# Patient Record
Sex: Male | Born: 1940 | Race: White | Hispanic: No | Marital: Married | Smoking: Never smoker
Health system: Southern US, Community
[De-identification: ages and names within clinical notes are randomized; demographics above are authoritative.]

## PROBLEM LIST (undated history)

## (undated) HISTORY — PX: EYE SURGERY: SHX253

---

## 2001-08-24 ENCOUNTER — Encounter: Payer: Self-pay | Admitting: Ophthalmology

## 2001-08-24 ENCOUNTER — Ambulatory Visit (HOSPITAL_COMMUNITY): Admission: RE | Admit: 2001-08-24 | Discharge: 2001-08-25 | Payer: Self-pay | Admitting: Ophthalmology

## 2002-04-03 ENCOUNTER — Encounter (INDEPENDENT_AMBULATORY_CARE_PROVIDER_SITE_OTHER): Payer: Self-pay

## 2002-04-03 ENCOUNTER — Ambulatory Visit (HOSPITAL_COMMUNITY): Admission: RE | Admit: 2002-04-03 | Discharge: 2002-04-03 | Payer: Self-pay | Admitting: Gastroenterology

## 2002-05-15 ENCOUNTER — Encounter: Admission: RE | Admit: 2002-05-15 | Discharge: 2002-08-13 | Payer: Self-pay | Admitting: Endocrinology

## 2003-05-14 ENCOUNTER — Ambulatory Visit (HOSPITAL_COMMUNITY): Admission: RE | Admit: 2003-05-14 | Discharge: 2003-05-14 | Payer: Self-pay | Admitting: Ophthalmology

## 2004-04-23 ENCOUNTER — Ambulatory Visit (HOSPITAL_BASED_OUTPATIENT_CLINIC_OR_DEPARTMENT_OTHER): Admission: RE | Admit: 2004-04-23 | Discharge: 2004-04-23 | Payer: Self-pay | Admitting: Plastic Surgery

## 2006-07-02 ENCOUNTER — Encounter: Admission: RE | Admit: 2006-07-02 | Discharge: 2006-07-02 | Payer: Self-pay | Admitting: Endocrinology

## 2006-11-04 ENCOUNTER — Encounter: Admission: RE | Admit: 2006-11-04 | Discharge: 2006-11-04 | Payer: Self-pay | Admitting: Endocrinology

## 2008-05-18 IMAGING — CT CT CHEST W/O CM
2 series · 11 of 33 positions shown, 12 images · non-contrast
Comparison: NONE

CLINICAL DATA: Diabetes.  Hypercholesterolemia.  Positive family 
history.  

CORONARY CT ANGIOGRAM
TECHNIQUE: Helical 64 slice gated CT angiography of the heart was 
performed following IV administration of 100 cc of Optiray 350.  
Segmentation of the coronary arteries was created in 2-D and 3-D 
volume rendered.  Ventricular wall motion was additionally 
assessed. NON-CORONARY

[Series 2: — · axial · 0.49mm/px · z∈[+997,+1055]mm · 3 of 70 slices shown, 4 images]
[im 24/70  mediastinal]
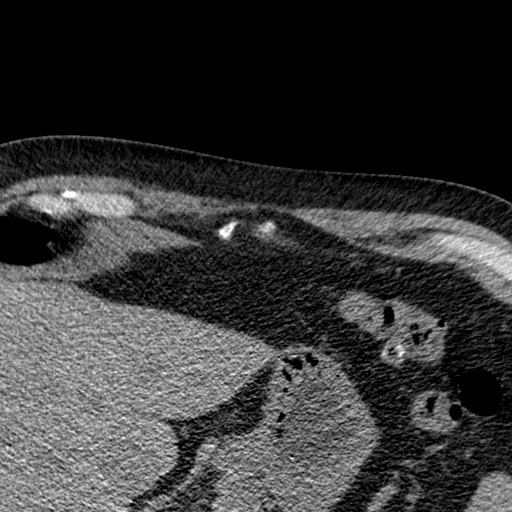
[im 24/70  lung]
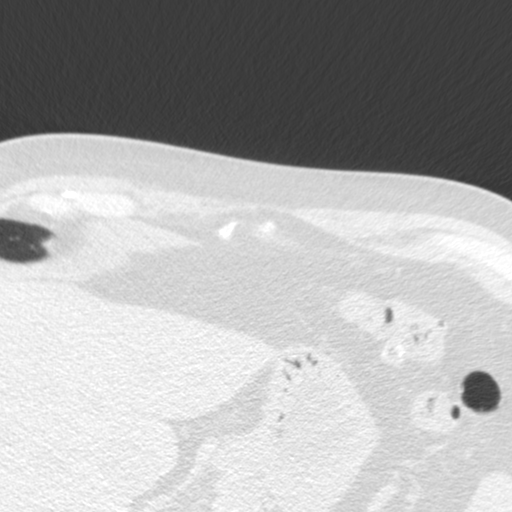
[im 31/70  lung]
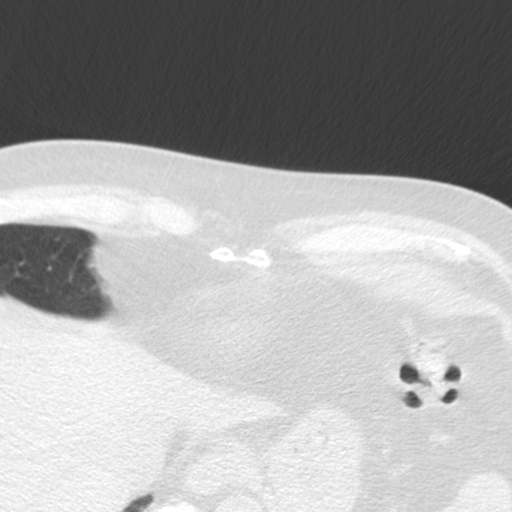
[im 47/70  lung]
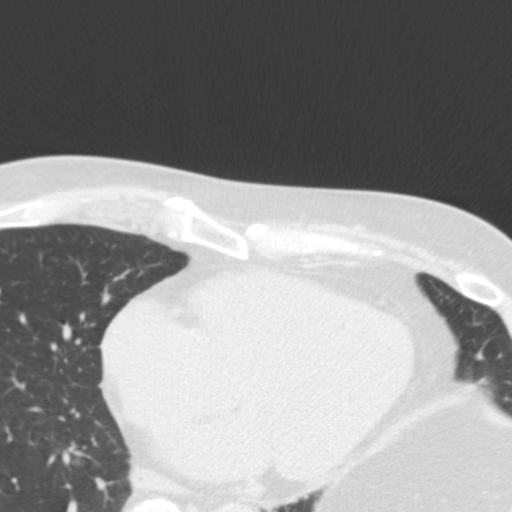

[Series 5: 75.0% · axial · 0.50mm/px · z∈[+950,+1095]mm · 8 of 393 slices shown]
[im 35/393  lung]
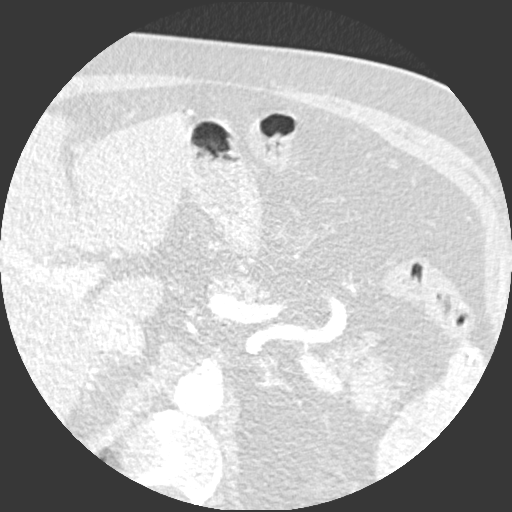
[im 86/393  lung]
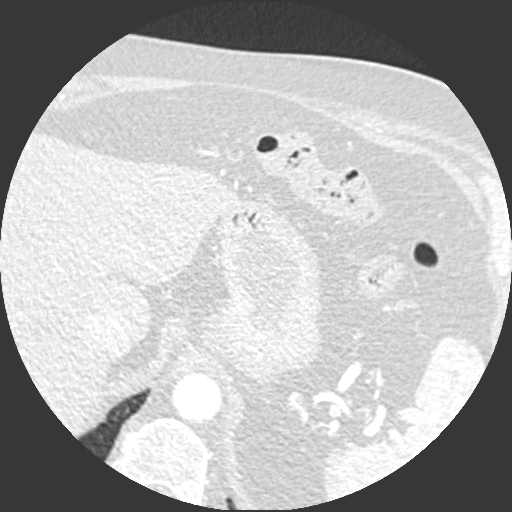
[im 137/393  lung]
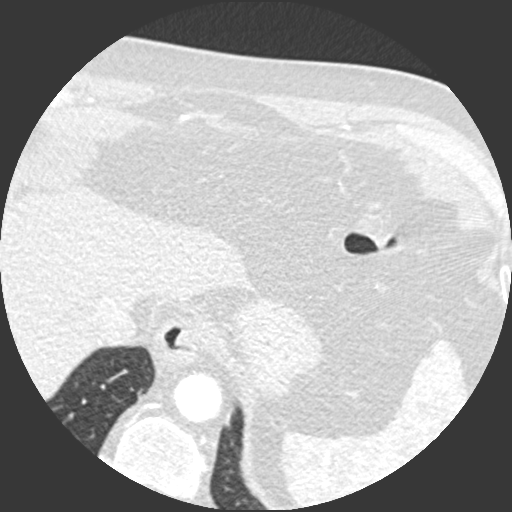
[im 177/393  lung]
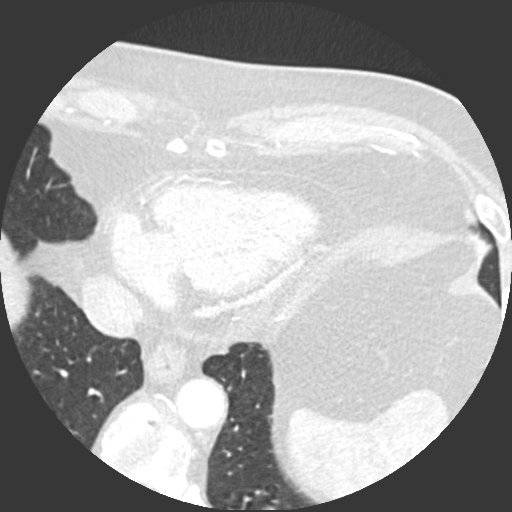
[im 205/393  lung]
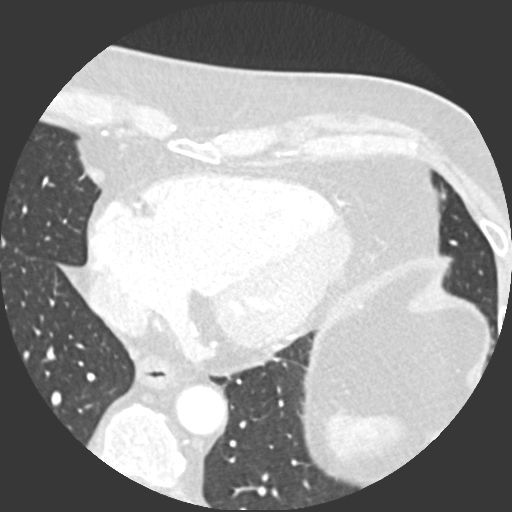
[im 256/393  lung]
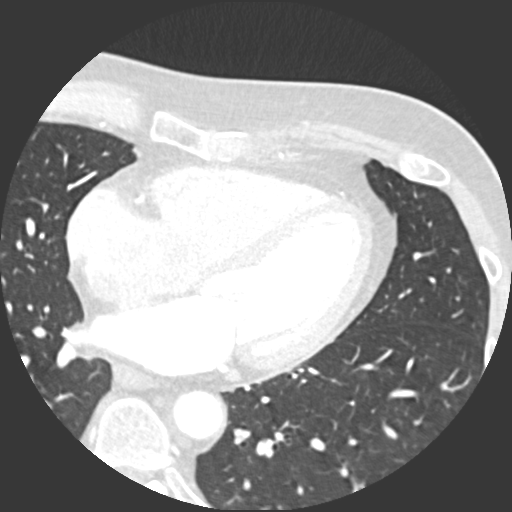
[im 307/393  lung]
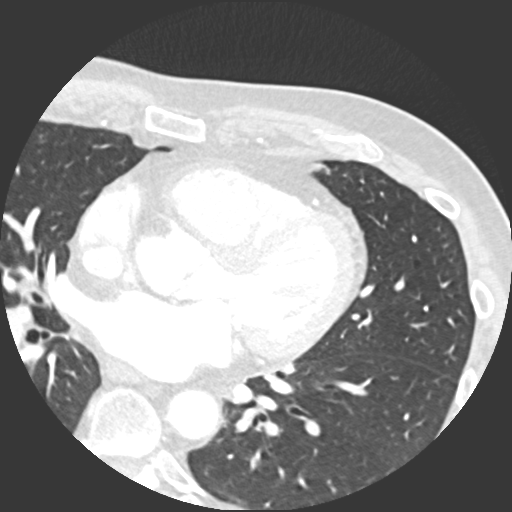
[im 358/393  lung]
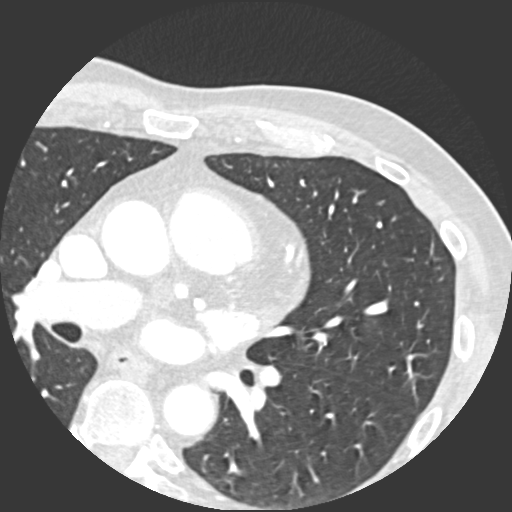

[11 of 33 positions shown; findings below may reference images not displayed]

FINDINGS: The visualized portions of the lung fields are clear.  
The aortic diameter is normal, ascending aorta 3.0 cm maximally.  
Slight calcific plaque in the aorta, no aneurysm or dissection.  
No central pulmonary emboli.  Atrium size is normal, no left 
atrial thrombus.  No septal defect.  Small calcification in the 
mitral annulus.  No significant valvular disease. Left Ventricular 
Wall Motion Normal myocardial contractility and myocardial 
thickness.  No scarring or infarct visible.  Left ventricular 
ejection fraction is 56%. CORONARY
FINDINGS: RIGHT CORONARY ARTERY The right coronary artery is a 
small artery, nondominant, terminating in acute marginal branches. 
 Mild diffuse atherosclerotic plaque, no significant occlusion. 
LEFT MAIN CORONARY ARTERY The left main coronary artery is short 
artery of large caliber with excentric calcific plaque, 
nonocclusive.  It gives rise to a left anterior descending and 
left circumflex coronary artery. LEFT ANTERIOR DESCENDING CORONARY 
ARTERY The left anterior descending coronary artery is a large 
artery containing mild nonocclusive calcific plaque proximally.  
There are also numerous small nonocclusive lipid plaques along the 
course of the artery.  The left anterior descending artery has a 
single large diagonal branch and several small perforators. LEFT 
CIRCUMFLEX CORONARY ARTERY The left circumflex coronary artery is 
a large artery supplying the posterior artery.  It has 2 large 
obtuse marginal branches.  Mild excentric nonocclusive calcific 
plaque is noted in the proximal circumflex coronary artery.  
Numerous smaller nonocclusive mixed calcific and lipid plaques are 
present along its proximal and mid course. SUMMARY: Left coronary 
artery dominance with a large circumflex coronary artery supplying 
the posterior descending artery.  Small right coronary artery with 
mild, nonocclusive, multifocal plaque; mixed lipid and calcific.  
No high-grade occlusion.  Normal left ventricular wall motion. 
Dict Date: 07/29/2006  Tran Date: 07/29/2006 NBC  JLM

## 2008-08-24 IMAGING — US US ABDOMEN COMPLETE
1 series · 14 of 25 positions shown · non-contrast
Comparison: None.

ABDOMEN ULTRASOUND:

CLINICAL DATA: Elevated bilirubin
TECHNIQUE: Complete abdominal ultrasound examination was performed including
evaluation of the liver, gallbladder, bile ducts, pancreas, kidneys, spleen,
IVC, and abdominal aorta.

[Series 1: us abdomen complete · 0.41mm/px · 14 of 99 slices shown]
[im 1/99]
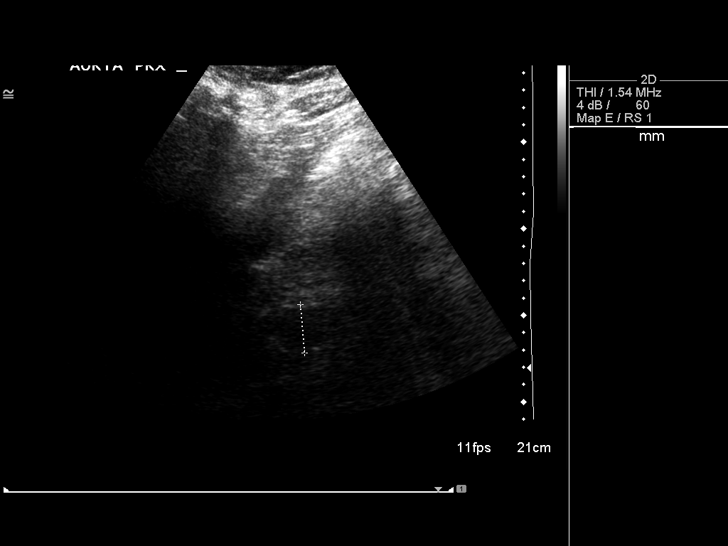
[im 9/99]
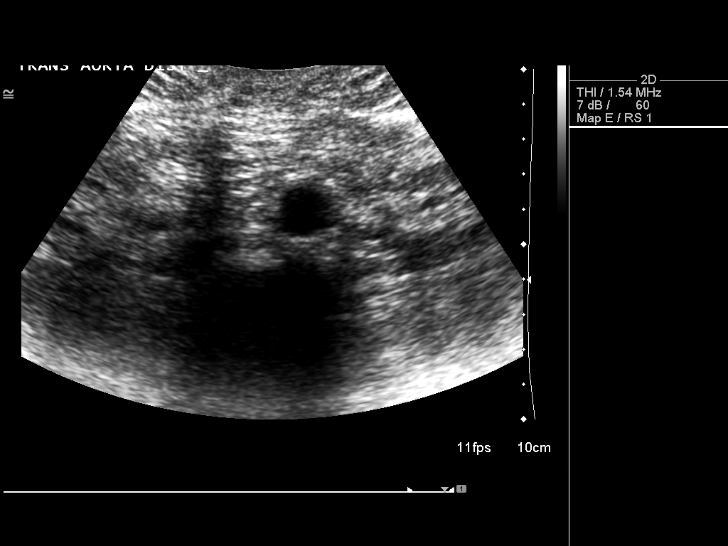
[im 17/99]
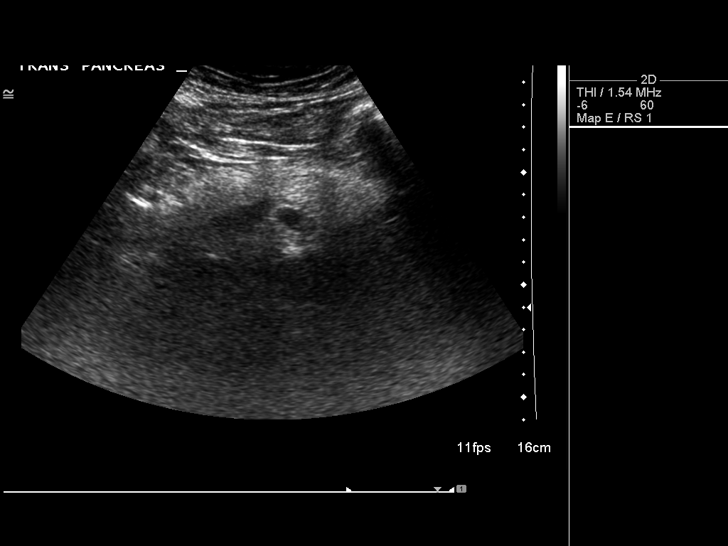
[im 25/99]
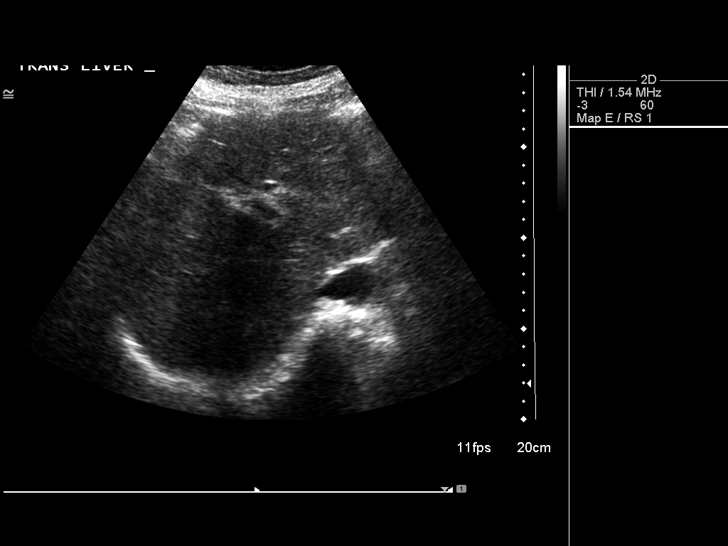
[im 33/99]
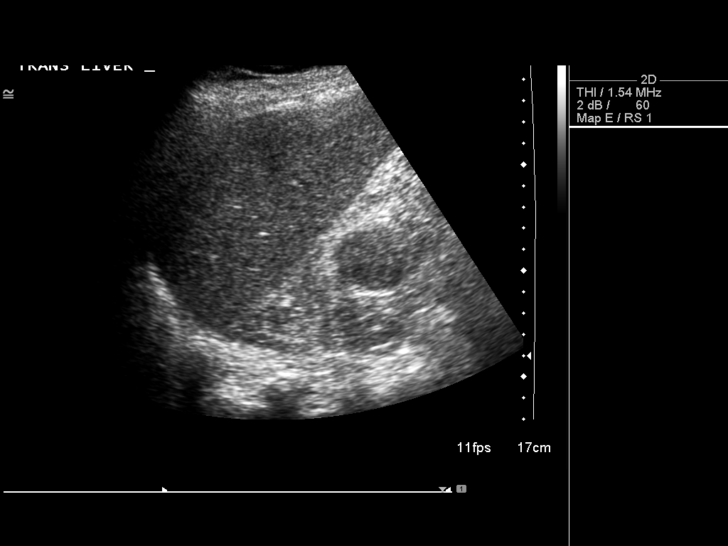
[im 37/99]
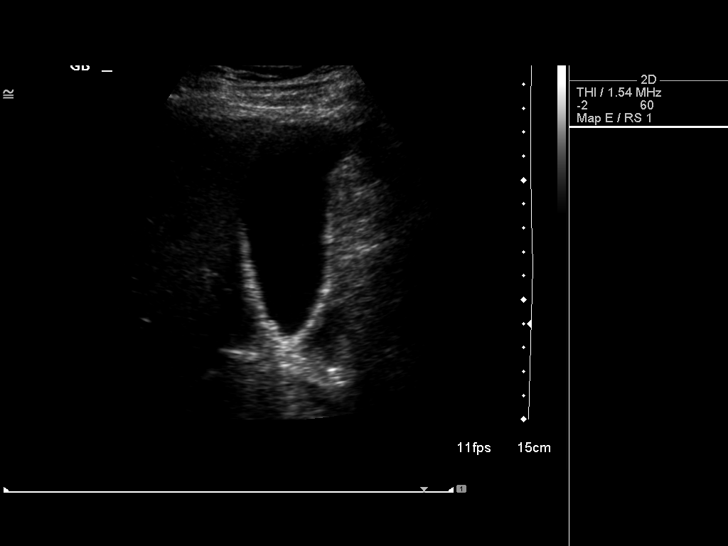
[im 45/99]
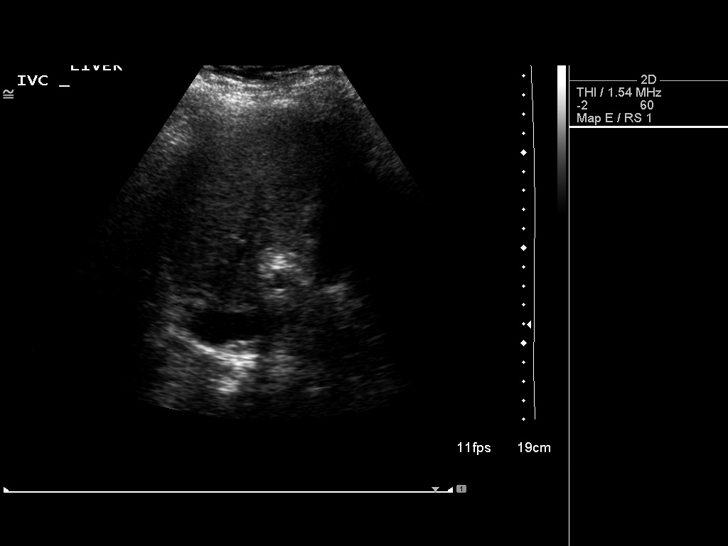
[im 54/99]
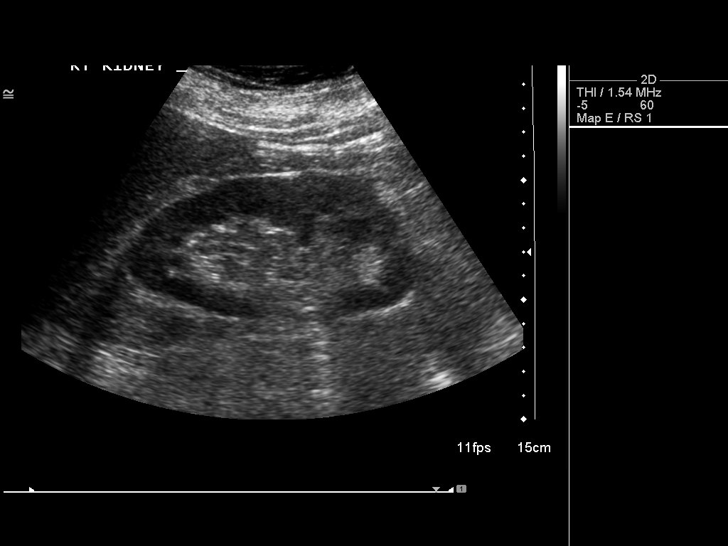
[im 62/99]
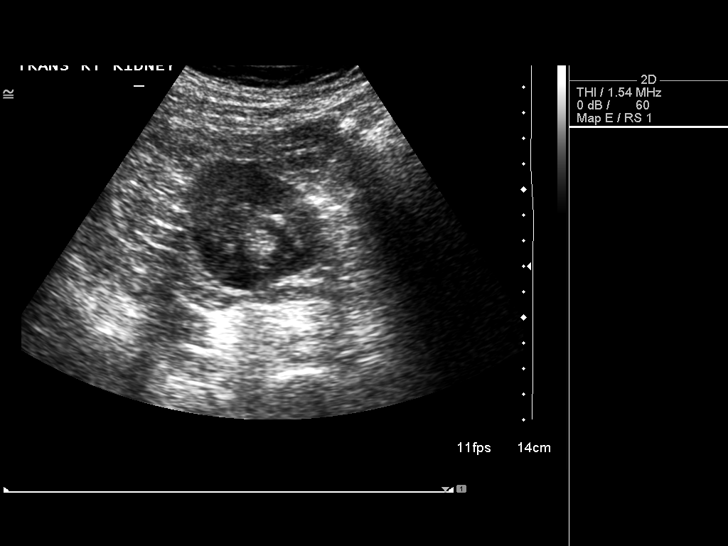
[im 66/99]
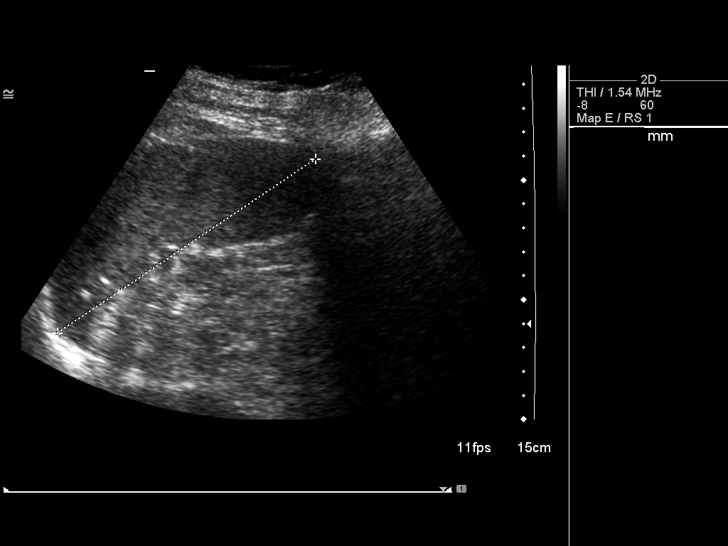
[im 74/99]
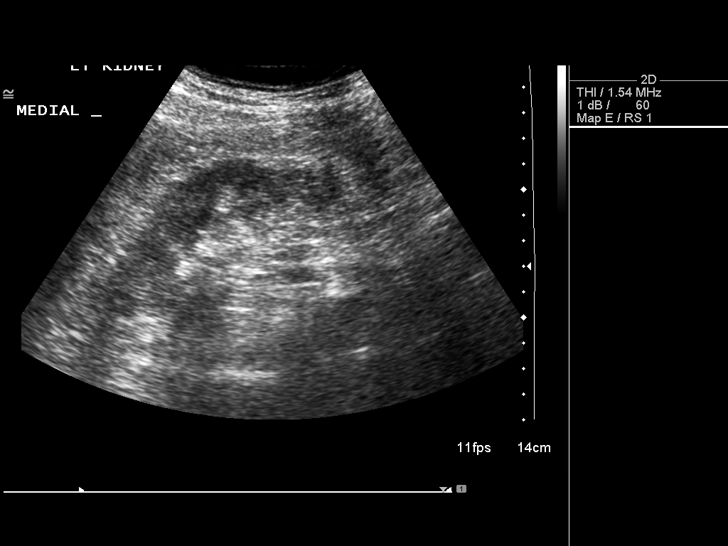
[im 82/99]
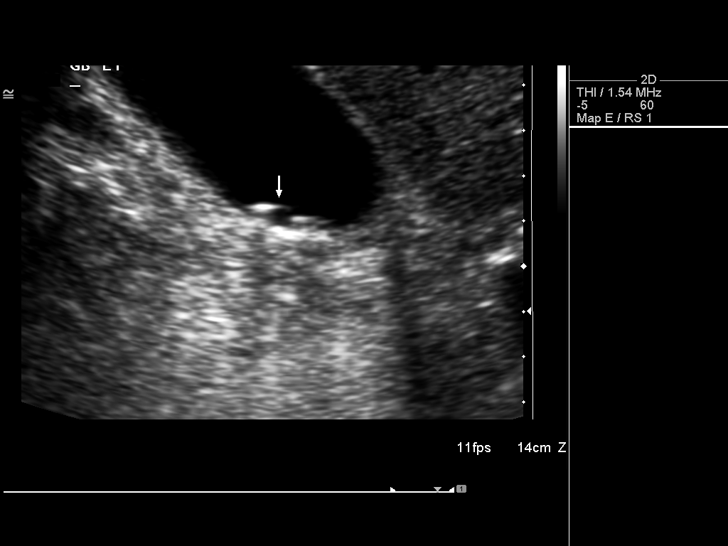
[im 90/99]
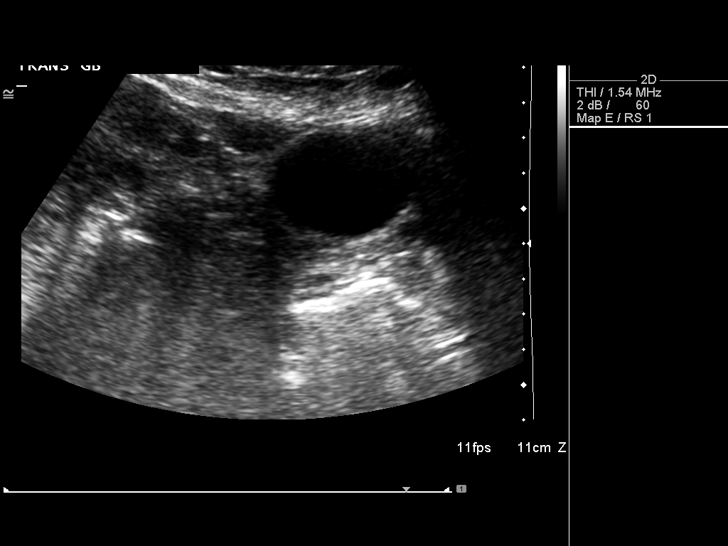
[im 99/99]
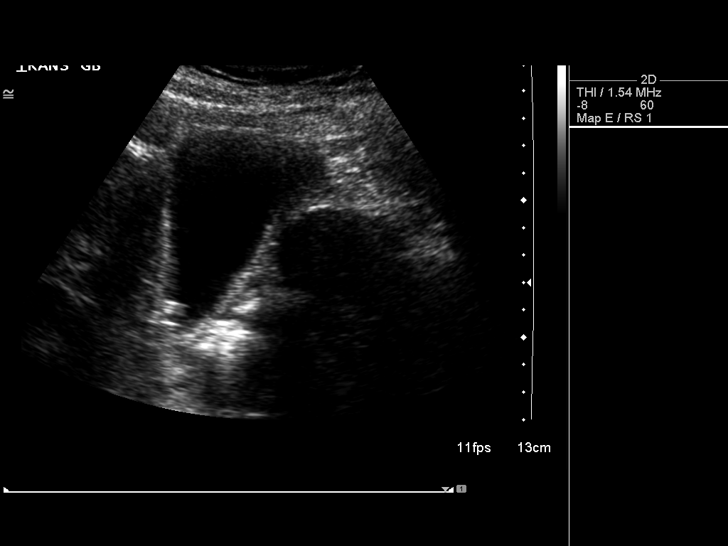

[14 of 25 positions shown; findings below may reference images not displayed]

FINDINGS: Gallbladder: A cluster of tiny echogenic foci in the dependent gallbladder
demonstrate posterior acoustic shadowing. Imaging features are most consistent
with gallstones. There is no gallbladder wall thickening or pericholecystic
fluid.

Common Bile Duct:  Nondilated

Liver:  Normal

Inferior Vena Cava:  Normal

Pancreas:  Limited visualization of the pancreatic tail.

Spleen:  Scattered calcified granulomata within the parenchyma.

Right Kidney:  12.3 cm in long axis.  Normal

Left Kidney:  12.7 cm in long axis.  Normal

Aorta:  No aneurysm
IMPRESSION: Cholelithiasis without gallbladder wall thickening or pericholecystic fluid.

## 2010-10-17 NOTE — Op Note (Signed)
[place]. [facility]  Patient:    Joseph Griffith, Joseph Griffith Visit Number: [id number] MRN: [id number]          Service Type: DSU Location: [phone] Attending Physician:  Ernesto Rutherford Dictated by:   Ernesto Rutherford, M.D. Proc. Date: 08/24/01 Admit Date:  08/24/2001 Discharge Date: 08/25/2001                             Operative Report  PREOPERATIVE DIAGNOSES: 1. Rhegmatogenous retinal detachment, right eye. 2. Macula threatened with subretinal fluid into the macula    pigmentaire.  Fovea still attached.  POSTOPERATIVE DIAGNOSES: 1. Rhegmatogenous retinal detachment, right eye. 2. Macula threatened with subretinal fluid into the macula    pigmentaire.  Fovea still attached.  OPERATION: 1. Scleral buckle with retinal cryopexy of the right eye using    240, 70, and 287 elements. 2. Drainage of subretinal fluid externally.  SURGEON:  Ernesto Rutherford, M.D.  ANESTHESIA:  General endotracheal anesthesia.  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  The patient understands that this is an attempt surgically to reattach the retina so as to provide preservation of vision, prevention of vision loss and the potential for regaining his full visual potential.  He understands the risks and benefits of anesthesia, rare occurrence of death, possible injury to the eye including hemorrhage, infection, scarring, need for further surgery, no change in vision, loss of vision or progression of disease despite intervention.  DESCRIPTION OF PROCEDURE:  After appropriate signed consent was obtained, the patient was taken to the operating room.  In the operating room, general endotracheal anesthesia ensued without difficulty.  The right periocular region was sterilely prepped and draped in the usual ophthalmic fashion.  Lid speculum applied.  Conjunctival peritomy fashioned  temporally and superiorly. A relaxing incision was made infranasally and supratemporally.  Rectus muscles were isolated with 2-0 silk ties.  Indirect ophthalmoscopy was then performed. Retinal cryopexy was applied to retinal hole at the 6 oclock position.  There was also a suspicious area at the 3 and 2:30 position.  The detachment extended from 2:30 to the 8:30 position.  These areas were treated. Preparation from the bed of buckle was performed and excellent buckle height was necessary inferiorly.  _____ _____________ was placed from the 2:30 meridian inferiorly to the 8:30 meridian.  A 240 band was used to encircle the globe.  These were joined end-to-end in the superonasal quadrant with a Watzke Watzke 7-0 sleeve.  At this time appropriate but temporary tension was applied.  External drainage of subretinal fluid was then carried out beneath the inferior rectus muscle approximately at the 7 oclock position initially the bed of the buckle.  All subretinal fluid was drained without difficulty. The retina flattened nicely on the buckle.  The buckle was then permanently tied at the appropriate tension.  The band was tied and secured and ophthalmoscopy confirmed that the retina was reattached 360 degrees.  The optic nerve and macula were perfused.  At this time, tenons was then brought forward and closed at the insertions of the rectus muscle on each quadrant with 7-0 Vicryl suture.  The conjunctivae was then closed with 7-0 Vicryl suture.  The bed of the buckle had been irrigated with bug juice. Subconjunctival injections of antibiotics were applied.  Antibiotics topically were not necessary as they  had been given subconjunctival.  Sterile patch and Fox shield were applied to the right eye.  Intraocular pressure had been established and found to be adequate.  The patient was awakened from anesthesia in good and stable condition and tolerated the procedure well without  complications. Dictated by:   Ernesto Rutherford, M.D. Attending Physician:  Ernesto Rutherford DD:  08/24/01 TD:  08/25/01 Job: [id number]

## 2010-10-17 NOTE — Op Note (Signed)
NAME:  Joseph Griffith, Joseph Griffith                          ACCOUNT NO.:  [id number]   MEDICAL RECORD NO.:  [id number]                   PATIENT TYPE:  OIB   LOCATION:  2550                                 FACILITY:  [facility]   PHYSICIAN:  Alford Highland. Rankin, M.D.                DATE OF BIRTH:  [birth year]   DATE OF PROCEDURE:  05/14/2003  DATE OF DISCHARGE:                                 OPERATIVE REPORT   PREOPERATIVE DIAGNOSIS:  Epiretinal membrane, left eye.   POSTOPERATIVE DIAGNOSIS:  Epiretinal membrane, left eye.   PROCEDURE:  Posterior vitrectomy and membrane peel--OS, of an epiretinal  membrane and an internal limiting membrane using 25-gauge vitrectomy system.   ANESTHESIA:  General endotracheal anesthesia per patient request.   SURGEON:  Alford Highland. Rankin, M.D.   INDICATIONS FOR PROCEDURE:  This 70 year old man with significant vision  loss, left eye, on the basis of __________ distortion and macular thickening  of the left eye.  This is an attempt to release and remove the macular  topographic distortion so as to allow for enhanced visual acuity  improvement.  The patient understands the risks of anesthesia including the  rare occurrence of death, problems with  the eye, including but not limited  to hemorrhage, infection, scarring, need for further surgery, no change in  vision, loss of vision, progressive disease despite intervention.  Appropriate informed consent was obtained.   PROCEDURE:  The patient was taken to the operating room.  In the operating  room, appropriate monitoring was applied followed by mild sedation and  general endotracheal anesthesia was then administered.  The left periocular  region was sterilely prepped and draped in the usual sterile fashion.  A  lens speculum was applied.  A conjunctival peritomy was then fashioned  temporally and superonasally.  A 4 mm infusion secured 3.5 mm posterior to  the limbus in the inferotemporal quadrant, placement in the vitreous  cavity  verified visually.  A superior sclerotomy was then fashioned.  A Wild  microscope was placed in position with BIOM attached.  A core vitrectomy was  then begun.  It must be noted that the 25-gauge trocar system was used for  the infusion as well as for the superior entry sites using a  transconjunctival approach.  Vitrectomy was then carried out without  difficulty.  A 25-gauge __________ was then used to engage the epiretinal  membrane.  This was removed in a continuous circular tear fashion, both for  an epiretinal and for internal limiting membrane removal.   A typical size removal of an internal limiting membrane was found.  Peripheral vitreous inspection was found to be free of retinal holes or  tears.  At this time, the trocars were then grasped, and plugs were placed.  Thereafter, the trocars removed and conjunctival and scleral depression were  then used for 20 seconds afterwards in each of the superior quadrants.  The  infusion was removed in a similar fashion, and compression was maintained.  The intraocular pressure was found to be adequate.   At this time, subconjunctival injections of antibiotic and steroids were  placed in the inferonasal quadrant away from the sclerotomy sites.   The patient tolerated the procedure well without complications.                                               Alford Highland Rankin, M.D.    GAR/MEDQ  D:  05/14/2003  T:  05/15/2003  Job:  [id number]

## 2012-08-08 ENCOUNTER — Other Ambulatory Visit: Payer: Self-pay | Admitting: Gastroenterology

## 2018-02-09 ENCOUNTER — Other Ambulatory Visit: Payer: Self-pay

## 2018-02-09 ENCOUNTER — Emergency Department (HOSPITAL_COMMUNITY)
Admission: EM | Admit: 2018-02-09 | Discharge: 2018-02-09 | Disposition: A | Discharge status: Home / Self Care | Payer: Medicare Other | Attending: Emergency Medicine | Admitting: Emergency Medicine

## 2018-02-09 ENCOUNTER — Encounter (HOSPITAL_COMMUNITY): Payer: Self-pay | Admitting: Emergency Medicine

## 2018-02-09 DIAGNOSIS — R109 Unspecified abdominal pain: Secondary | ICD-10-CM | POA: Insufficient documentation

## 2018-02-09 DIAGNOSIS — Z5321 Procedure and treatment not carried out due to patient leaving prior to being seen by health care provider: Secondary | ICD-10-CM | POA: Insufficient documentation

## 2018-02-09 LAB — URINALYSIS, ROUTINE W REFLEX MICROSCOPIC
Bilirubin Urine: NEGATIVE
GLUCOSE, UA: NEGATIVE mg/dL
Hgb urine dipstick: NEGATIVE
KETONES UR: 20 mg/dL — AB
LEUKOCYTES UA: NEGATIVE
Nitrite: NEGATIVE
Protein, ur: NEGATIVE mg/dL
Specific Gravity, Urine: 1.029 (ref 1.005–1.030)
pH: 5 (ref 5.0–8.0)

## 2018-02-09 LAB — CBC
HEMATOCRIT: 48.5 % (ref 39.0–52.0)
Hemoglobin: 15.5 g/dL (ref 13.0–17.0)
MCH: 28.9 pg (ref 26.0–34.0)
MCHC: 32 g/dL (ref 30.0–36.0)
MCV: 90.5 fL (ref 78.0–100.0)
Platelets: 187 10*3/uL (ref 150–400)
RBC: 5.36 MIL/uL (ref 4.22–5.81)
RDW: 13.6 % (ref 11.5–15.5)
WBC: 8.7 10*3/uL (ref 4.0–10.5)

## 2018-02-09 LAB — COMPREHENSIVE METABOLIC PANEL
ALBUMIN: 3.5 g/dL (ref 3.5–5.0)
ALT: 14 U/L (ref 0–44)
AST: 19 U/L (ref 15–41)
Alkaline Phosphatase: 56 U/L (ref 38–126)
Anion gap: 12 (ref 5–15)
BUN: 17 mg/dL (ref 8–23)
CHLORIDE: 104 mmol/L (ref 98–111)
CO2: 21 mmol/L — ABNORMAL LOW (ref 22–32)
Calcium: 8.7 mg/dL — ABNORMAL LOW (ref 8.9–10.3)
Creatinine, Ser: 0.88 mg/dL (ref 0.61–1.24)
GFR calc Af Amer: >60 mL/min (ref >60)
GFR calc non Af Amer: >60 mL/min (ref >60)
GLUCOSE: 163 mg/dL — AB (ref 70–99)
Potassium: 4.8 mmol/L (ref 3.5–5.1)
Sodium: 137 mmol/L (ref 135–145)
Total Bilirubin: 2.3 mg/dL — ABNORMAL HIGH (ref 0.3–1.2)
Total Protein: 5.9 g/dL — ABNORMAL LOW (ref 6.5–8.1)

## 2018-02-09 LAB — TYPE AND SCREEN
ABO/RH(D): O NEG
Antibody Screen: NEGATIVE

## 2018-02-09 LAB — ABO/RH: ABO/RH(D): O NEG

## 2018-02-09 LAB — LIPASE, BLOOD: LIPASE: 30 U/L (ref 11–51)

## 2018-02-09 NOTE — ED Provider Notes (Cosign Needed)
Patient placed in Quick Look pathway, seen and evaluated   Chief Complaint: abdominal pain  HPI:   Joseph Griffith is a 77 y.o. male who presents to the ED with abdominal pain and bloody stools. Patient had been on a cruise for his 55th wedding anniversary.  Patient reports having 5 watery bloody stools today. Patient c/o feeling weak and dizzy. Patient reports that other people on the ship had respiratory symptoms but none with diarrhea.   ROS: GI: abdominal pain, bloody stools  General: weak feeling  Physical Exam:  BP 114/80 (BP Location: Right Arm)   Pulse (!) 126   Temp 98.9 F (37.2 C) (Oral)   Resp 18   Ht 6' (1.829 m)   Wt 100.2 kg   SpO2 99%   BMI 29.97 kg/m    Gen: No distress  Neuro: Awake and Alert  Skin: Warm and dry   Initiation of care has begun. The patient has been counseled on the process, plan, and necessity for staying for the completion/evaluation, and the remainder of the medical screening examination    Janne Napoleon, NP 02/09/18 1742

## 2018-02-09 NOTE — ED Notes (Signed)
PT WANTED TO LEAVE AND WAS ADVISED TO STAY. HE STATED HE HAD TO TAKE SOMEONE TO [place] AND WOULD COME BACK TOMORROW MORNING.

## 2018-02-09 NOTE — ED Triage Notes (Signed)
Pt reports recently being on a cruise. Pt reports having abdominal pain. Pt reports bloody diarrhea, pt has had 5 episodes today.
